# Patient Record
Sex: Female | Born: 1980 | Race: Black or African American | Hispanic: No | Smoking: Never smoker
Health system: Southern US, Community
[De-identification: ages and names within clinical notes are randomized; demographics above are authoritative.]

## PROBLEM LIST (undated history)

## (undated) DIAGNOSIS — F419 Anxiety disorder, unspecified: Secondary | ICD-10-CM

---

## 2013-09-25 ENCOUNTER — Emergency Department (HOSPITAL_COMMUNITY)

## 2013-09-25 ENCOUNTER — Emergency Department (HOSPITAL_COMMUNITY)
Admission: EM | Admit: 2013-09-25 | Discharge: 2013-09-25 | Disposition: A | Discharge status: Home / Self Care | Attending: Emergency Medicine | Admitting: Emergency Medicine

## 2013-09-25 ENCOUNTER — Encounter (HOSPITAL_COMMUNITY): Payer: Self-pay | Admitting: Emergency Medicine

## 2013-09-25 DIAGNOSIS — N949 Unspecified condition associated with female genital organs and menstrual cycle: Secondary | ICD-10-CM | POA: Insufficient documentation

## 2013-09-25 DIAGNOSIS — Z880 Allergy status to penicillin: Secondary | ICD-10-CM | POA: Insufficient documentation

## 2013-09-25 DIAGNOSIS — F411 Generalized anxiety disorder: Secondary | ICD-10-CM | POA: Insufficient documentation

## 2013-09-25 DIAGNOSIS — R079 Chest pain, unspecified: Secondary | ICD-10-CM | POA: Insufficient documentation

## 2013-09-25 DIAGNOSIS — R209 Unspecified disturbances of skin sensation: Secondary | ICD-10-CM | POA: Insufficient documentation

## 2013-09-25 HISTORY — DX: Anxiety disorder, unspecified: F41.9

## 2013-09-25 LAB — BASIC METABOLIC PANEL
BUN: 13 mg/dL (ref 6–23)
CO2: 28 mEq/L (ref 19–32)
Calcium: 8.8 mg/dL (ref 8.4–10.5)
Chloride: 101 mEq/L (ref 96–112)
Creatinine, Ser: 0.62 mg/dL (ref 0.50–1.10)
GFR calc Af Amer: >90 mL/min (ref >90)
GFR calc non Af Amer: >90 mL/min (ref >90)
Glucose, Bld: 82 mg/dL (ref 70–99)
Potassium: 4 mEq/L (ref 3.7–5.3)
Sodium: 136 mEq/L — ABNORMAL LOW (ref 137–147)

## 2013-09-25 LAB — CBC
HCT: 35.2 % — ABNORMAL LOW (ref 36.0–46.0)
Hemoglobin: 11 g/dL — ABNORMAL LOW (ref 12.0–15.0)
MCH: 21.2 pg — ABNORMAL LOW (ref 26.0–34.0)
MCHC: 31.3 g/dL (ref 30.0–36.0)
MCV: 67.7 fL — ABNORMAL LOW (ref 78.0–100.0)
Platelets: 226 10*3/uL (ref 150–400)
RBC: 5.2 MIL/uL — ABNORMAL HIGH (ref 3.87–5.11)
RDW: 15.4 % (ref 11.5–15.5)
WBC: 9.2 10*3/uL (ref 4.0–10.5)

## 2013-09-25 LAB — POCT I-STAT TROPONIN I: Troponin i, poc: 0.01 ng/mL (ref 0.00–0.08)

## 2013-09-25 MED ORDER — LORAZEPAM 0.5 MG PO TABS: 0.5000 mg | ORAL_TABLET | Freq: Three times a day (TID) | ORAL | Status: AC | PRN

## 2013-09-25 NOTE — Discharge Instructions (Signed)
Chest Pain (Nonspecific) °It is often hard to give a specific diagnosis for the cause of chest pain. There is always a chance that your pain could be related to something serious, such as a heart attack or a blood clot in the lungs. You need to follow up with your caregiver for further evaluation. °CAUSES  °· Heartburn. °· Pneumonia or bronchitis. °· Anxiety or stress. °· Inflammation around your heart (pericarditis) or lung (pleuritis or pleurisy). °· A blood clot in the lung. °· A collapsed lung (pneumothorax). It can develop suddenly on its own (spontaneous pneumothorax) or from injury (trauma) to the chest. °· Shingles infection (herpes zoster virus). °The chest wall is composed of bones, muscles, and cartilage. Any of these can be the source of the pain. °· The bones can be bruised by injury. °· The muscles or cartilage can be strained by coughing or overwork. °· The cartilage can be affected by inflammation and become sore (costochondritis). °DIAGNOSIS  °Lab tests or other studies, such as X-rays, electrocardiography, stress testing, or cardiac imaging, may be needed to find the cause of your pain.  °TREATMENT  °· Treatment depends on what may be causing your chest pain. Treatment may include: °· Acid blockers for heartburn. °· Anti-inflammatory medicine. °· Pain medicine for inflammatory conditions. °· Antibiotics if an infection is present. °· You may be advised to change lifestyle habits. This includes stopping smoking and avoiding alcohol, caffeine, and chocolate. °· You may be advised to keep your head raised (elevated) when sleeping. This reduces the chance of acid going backward from your stomach into your esophagus. °· Most of the time, nonspecific chest pain will improve within 2 to 3 days with rest and mild pain medicine. °HOME CARE INSTRUCTIONS  °· If antibiotics were prescribed, take your antibiotics as directed. Finish them even if you start to feel better. °· For the next few days, avoid physical  activities that bring on chest pain. Continue physical activities as directed. °· Do not smoke. °· Avoid drinking alcohol. °· Only take over-the-counter or prescription medicine for pain, discomfort, or fever as directed by your caregiver. °· Follow your caregiver's suggestions for further testing if your chest pain does not go away. °· Keep any follow-up appointments you made. If you do not go to an appointment, you could develop lasting (chronic) problems with pain. If there is any problem keeping an appointment, you must call to reschedule. °SEEK MEDICAL CARE IF:  °· You think you are having problems from the medicine you are taking. Read your medicine instructions carefully. °· Your chest pain does not go away, even after treatment. °· You develop a rash with blisters on your chest. °SEEK IMMEDIATE MEDICAL CARE IF:  °· You have increased chest pain or pain that spreads to your arm, neck, jaw, back, or abdomen. °· You develop shortness of breath, an increasing cough, or you are coughing up blood. °· You have severe back or abdominal pain, feel nauseous, or vomit. °· You develop severe weakness, fainting, or chills. °· You have a fever. °THIS IS AN EMERGENCY. Do not wait to see if the pain will go away. Get medical help at once. Call your local emergency services (911 in [place] Do not drive yourself to the hospital. °MAKE SURE YOU:  °· Understand these instructions. °· Will watch your condition. °· Will get help right away if you are not doing well or get worse. °Document Released: 05/25/2005 Document Revised: 11/07/2011 Document Reviewed: 03/20/2008 °Exit[employer]® Patient Information ©2014 [employer]   [employer].  Electrocardiography Electrocardiography is an important test that records the electrical impulses of the heart. It assesses many aspects of heart health, including:  Heart function.  Heart rhythm.  Heart muscle thickness. Electrocardiography can be done as a routine part of a physical exam. It can also be  done to evaluate symptoms such as severe chest pain and heart palpitations. PROCEDURE   Electrocardiography is simple, safe, and painless. It takes only a few minutes to perform. No electricity goes through your body during the procedure.  You will be asked to remove your clothes from the waist up and lie on your back for the test.  Sticky patches (electrodes) will be placed on your chest, arms, and legs. The electrodes will be attached by wires to the electrocardiography machine.  You will be asked to relax and lie still for a few seconds while the electrocardiography machine records the electrical activity of your heart. AFTER THE PROCEDURE  If electrocardiography is part of a routine physical exam, you may return to normal activities as told by your caregiver.  Your caregiver or a heart doctor (cardiologist) will interpret the recording.  The test result may not be available during your visit. If your test result is not back during the visit, make an appointment with your caregiver to find out the result. It is important for you to follow up on your test result. Document Released: 08/12/2000 Document Revised: 02/14/2012 Document Reviewed: 12/26/2011 [facility] Patient Information 2014 [place], [place].

## 2013-09-25 NOTE — ED Provider Notes (Signed)
Medical screening examination/treatment/procedure(s) were performed by non-physician practitioner and as supervising physician I was immediately available for consultation/collaboration.  EKG Interpretation    Date/Time:  Wednesday September 25 2013 16:22:35 EST Ventricular Rate:  67 PR Interval:  166 QRS Duration: 73 QT Interval:  391 QTC Calculation: 413 R Axis:   61 Text Interpretation:  Sinus rhythm Baseline wander in lead(s) V3 V4 V5 V6 Confirmed by Romeo AppleHARRISON  MD, Kinisha Soper ([id number]) on 09/25/2013 11:48:51 PM              Randa SpikeForrest Mort SawyersS Charlei Ramsaran, MD 09/25/13 2349

## 2013-09-25 NOTE — ED Provider Notes (Signed)
CSN: [id number]     Arrival date & time 09/25/13  1608 History   First MD Initiated Contact with Patient 09/25/13 1609     Chief Complaint  Patient presents with  . Chest Pain  . Numbness   (Consider location/radiation/quality/duration/timing/severity/associated sxs/prior Treatment) HPI  Patient presents to the ER with complaints of heart pains for the past two weeks. She says that intermittently has pains in her heart that last 15 minutes. The start after she has been talking to someone for 2-3 minutes or if she is "thinking about something". She says that she gets to where she is unable to speak, her arm and face get tingly. She admits that her and her mom are higher stress than her sister but she is resistant to the symptoms being caused by anxiety. She says if she focuses on her breathing and relaxes she is able to get the symptoms to go away. She has never become diaphoretic, nauseous, had vomiting, diarrhea, weakness, coughing, confusion or any other symptoms associated. She reports this happened back in 2009 and they went away after divorcing her husband.  Past Medical History  Diagnosis Date  . Anxiety    No past surgical history on file. No family history on file. History  Substance Use Topics  . Smoking status: Never Smoker   . Smokeless tobacco: Not on file  . Alcohol Use: Not on file   OB History   Grav Para Term Preterm Abortions TAB SAB Ect Mult Living                 Review of Systems  Constitutional: Negative for fever, chills, diaphoresis and fatigue.  Respiratory: Positive for chest tightness.   Cardiovascular: Positive for chest pain.  Endocrine: Negative for polydipsia.  Genitourinary: Positive for pelvic pain. Negative for dysuria.  Neurological: Negative for light-headedness and numbness.    Allergies  Penicillins  Home Medications   Current Outpatient Rx  Name  Route  Sig  Dispense  Refill  . LORazepam (ATIVAN) 0.5 MG tablet   Oral   Take 1  tablet (0.5 mg total) by mouth 3 (three) times daily as needed (for chest tightness).   15 tablet   0    BP 110/71  Pulse 75  Temp(Src) 98.4 F (36.9 C) (Oral)  Resp 23  SpO2 100%  LMP 09/18/2013 Physical Exam  Nursing note and vitals reviewed. Constitutional: She appears well-developed and well-nourished. No distress.  HENT:  Head: Normocephalic and atraumatic.  Eyes: Pupils are equal, round, and reactive to light.  Neck: Normal range of motion. Neck supple.  Cardiovascular: Normal rate and regular rhythm.   Pulmonary/Chest: Effort normal and breath sounds normal. No accessory muscle usage. No respiratory distress. Chest wall is not dull to percussion. She exhibits no mass, no tenderness, no bony tenderness, no edema, no deformity and no retraction.  Abdominal: Soft.  Neurological: She is alert.  Skin: Skin is warm and dry.    ED Course  Procedures (including critical care time) Labs Review Labs Reviewed  CBC - Abnormal; Notable for the following:    RBC 5.20 (*)    Hemoglobin 11.0 (*)    HCT 35.2 (*)    MCV 67.7 (*)    MCH 21.2 (*)    All other components within normal limits  BASIC METABOLIC PANEL - Abnormal; Notable for the following:    Sodium 136 (*)    All other components within normal limits  POCT I-STAT TROPONIN I   Imaging  Review Dg Chest 2 View  09/25/2013   CLINICAL DATA:  Upper left chest and upper extremity discomfort.  EXAM: CHEST  2 VIEW  COMPARISON:  None.  FINDINGS: The lungs are mildly hypoinflated. The interstitial markings are prominent. The cardiac silhouette and mediastinal structures are within the limits of normal. The pulmonary vascularity is not engorged. There is no pleural effusion or pneumothorax. The observed portions of the bony thorax appear normal.  IMPRESSION: There prominence of the interstitial markings likely secondary to hypo inflation. There is no alveolar pneumonia nor evidence of CHF. One cannot exclude acute bronchitis in the  appropriate clinical setting.   Electronically Signed   By: David  Swazil[place]   On: 09/25/2013 18:35    EKG Interpretation   None       MDM   1. Chest pain    Patients pains and symptoms seem to be consistent with having panic attacks but she is resistant to this being the cause of her pains. She had pain this morning and has no other episodes. She is PERC negative and the intermittent nature of her pains is not consistent with PE, pericarditis, cardiovascular disease. She may be having intermittent arrhythmias. Will refer to cardiology to have patient wear halter monitor to see what her heart is doing when she is symptomatic.  33 y.o.Naoma Emanuele's evaluation in the Emergency Department is complete. It has been determined that no acute conditions requiring further emergency intervention are present at this time. The patient/guardian have been advised of the diagnosis and plan. We have discussed signs and symptoms that warrant return to the ED, such as changes or worsening in symptoms.  Vital signs are stable at discharge. Filed Vitals:   09/25/13 1612  BP: 110/71  Pulse: 75  Temp: 98.4 F (36.9 C)  Resp: 23    Patient/guardian has voiced understanding and agreed to follow-up with the PCP or specialist.     Dorthula Matasiffany G Pamula Luther, PA-C 09/25/13 2137

## 2013-09-25 NOTE — ED Notes (Signed)
Pt c/o "tightness around heart" for 2 weeks. Pt states the tightness is associated with numbness in her L arm. Pt states now over the past 2 days she gets dizzy when she feels "pinching" around her heart. Pt denies nausea. Pt states she has had shortness of breath for 2 days with these symptoms also. Pt denies any chest pain at present. Pt alert, no acute distress. Skin warm and dry.

## 2015-04-28 IMAGING — CR DG CHEST 2V
2 series · 2 of 2 positions shown · non-contrast
Comparison: None.

CLINICAL DATA: Upper left chest and upper extremity discomfort.

EXAM:
CHEST  2 VIEW

[w chest pa]
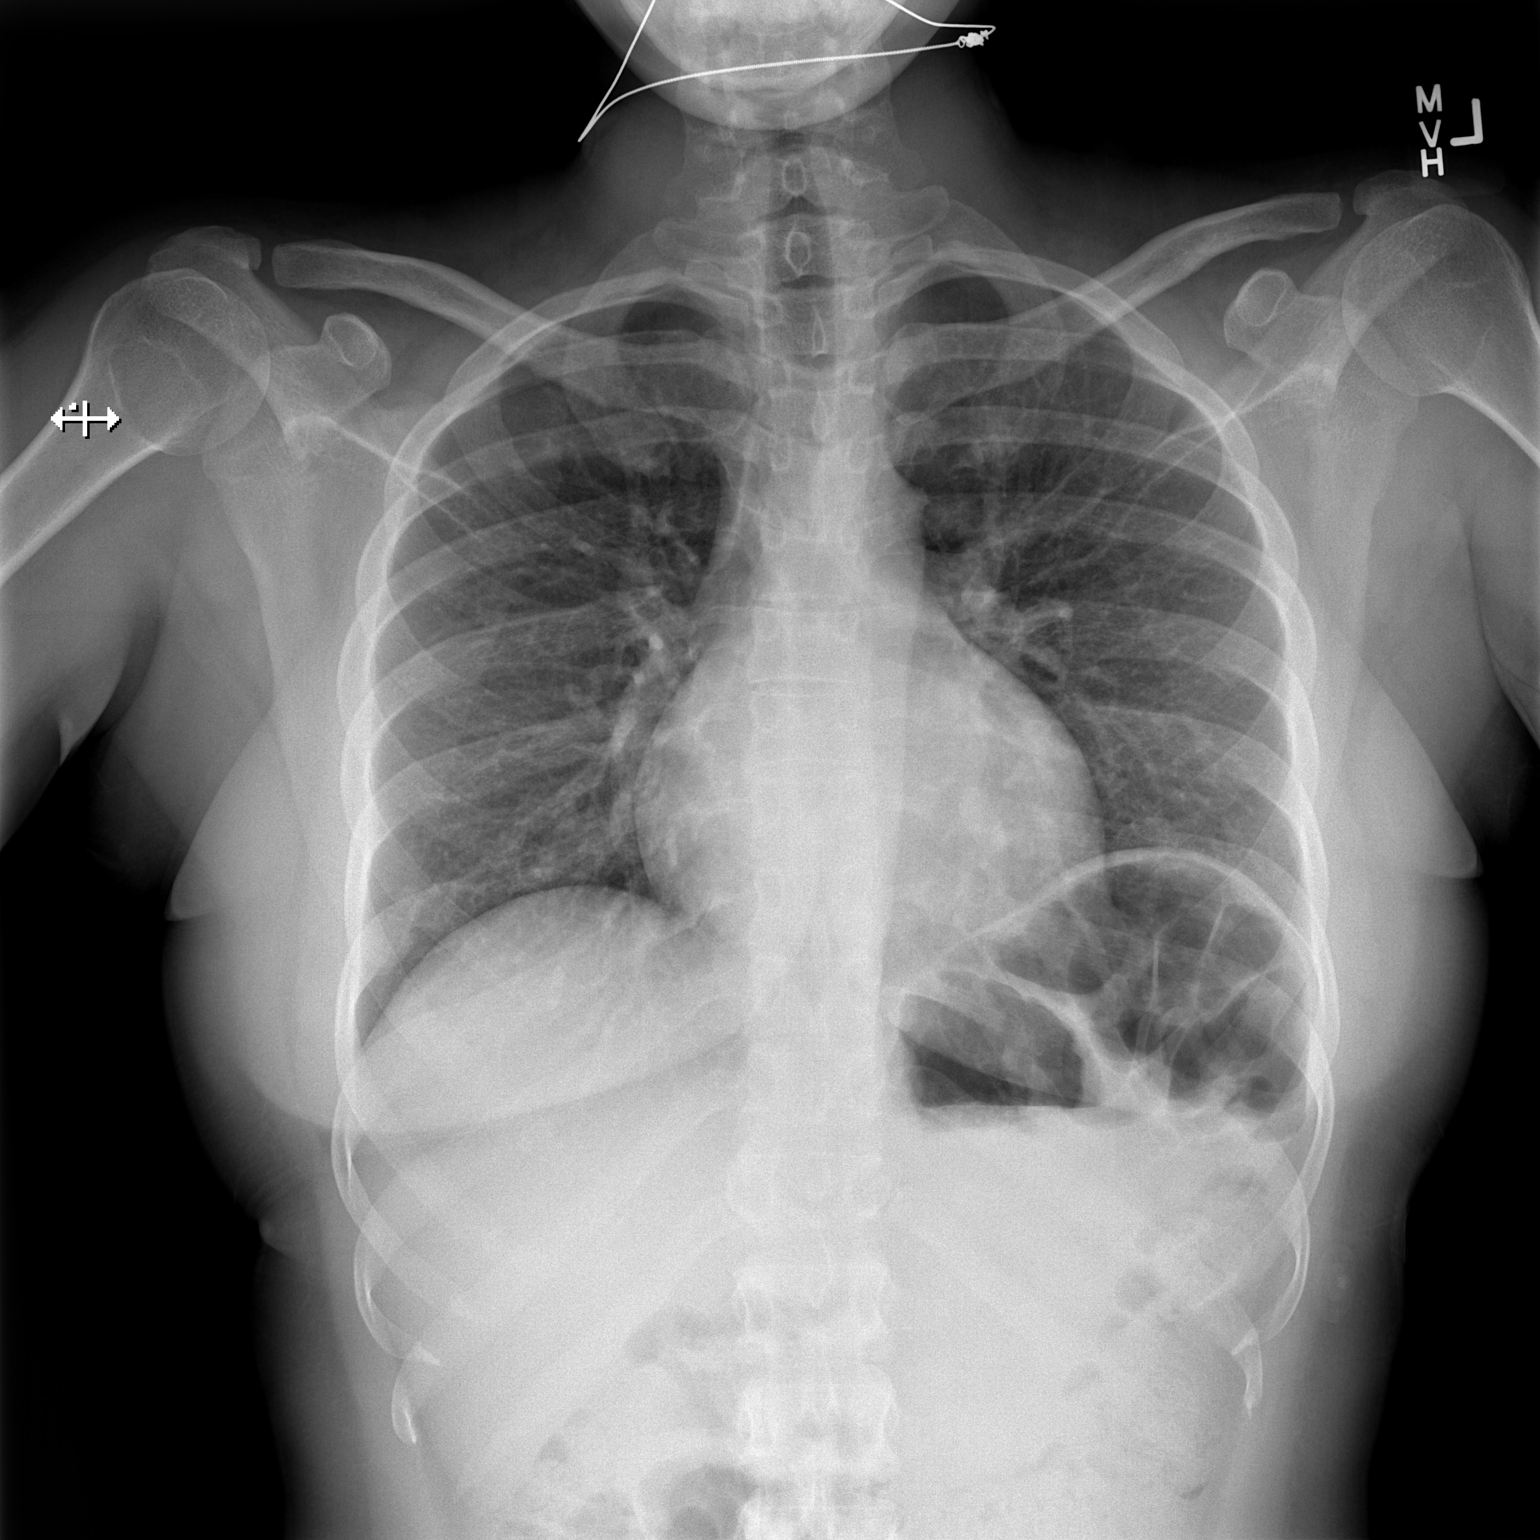

[w chest lat]
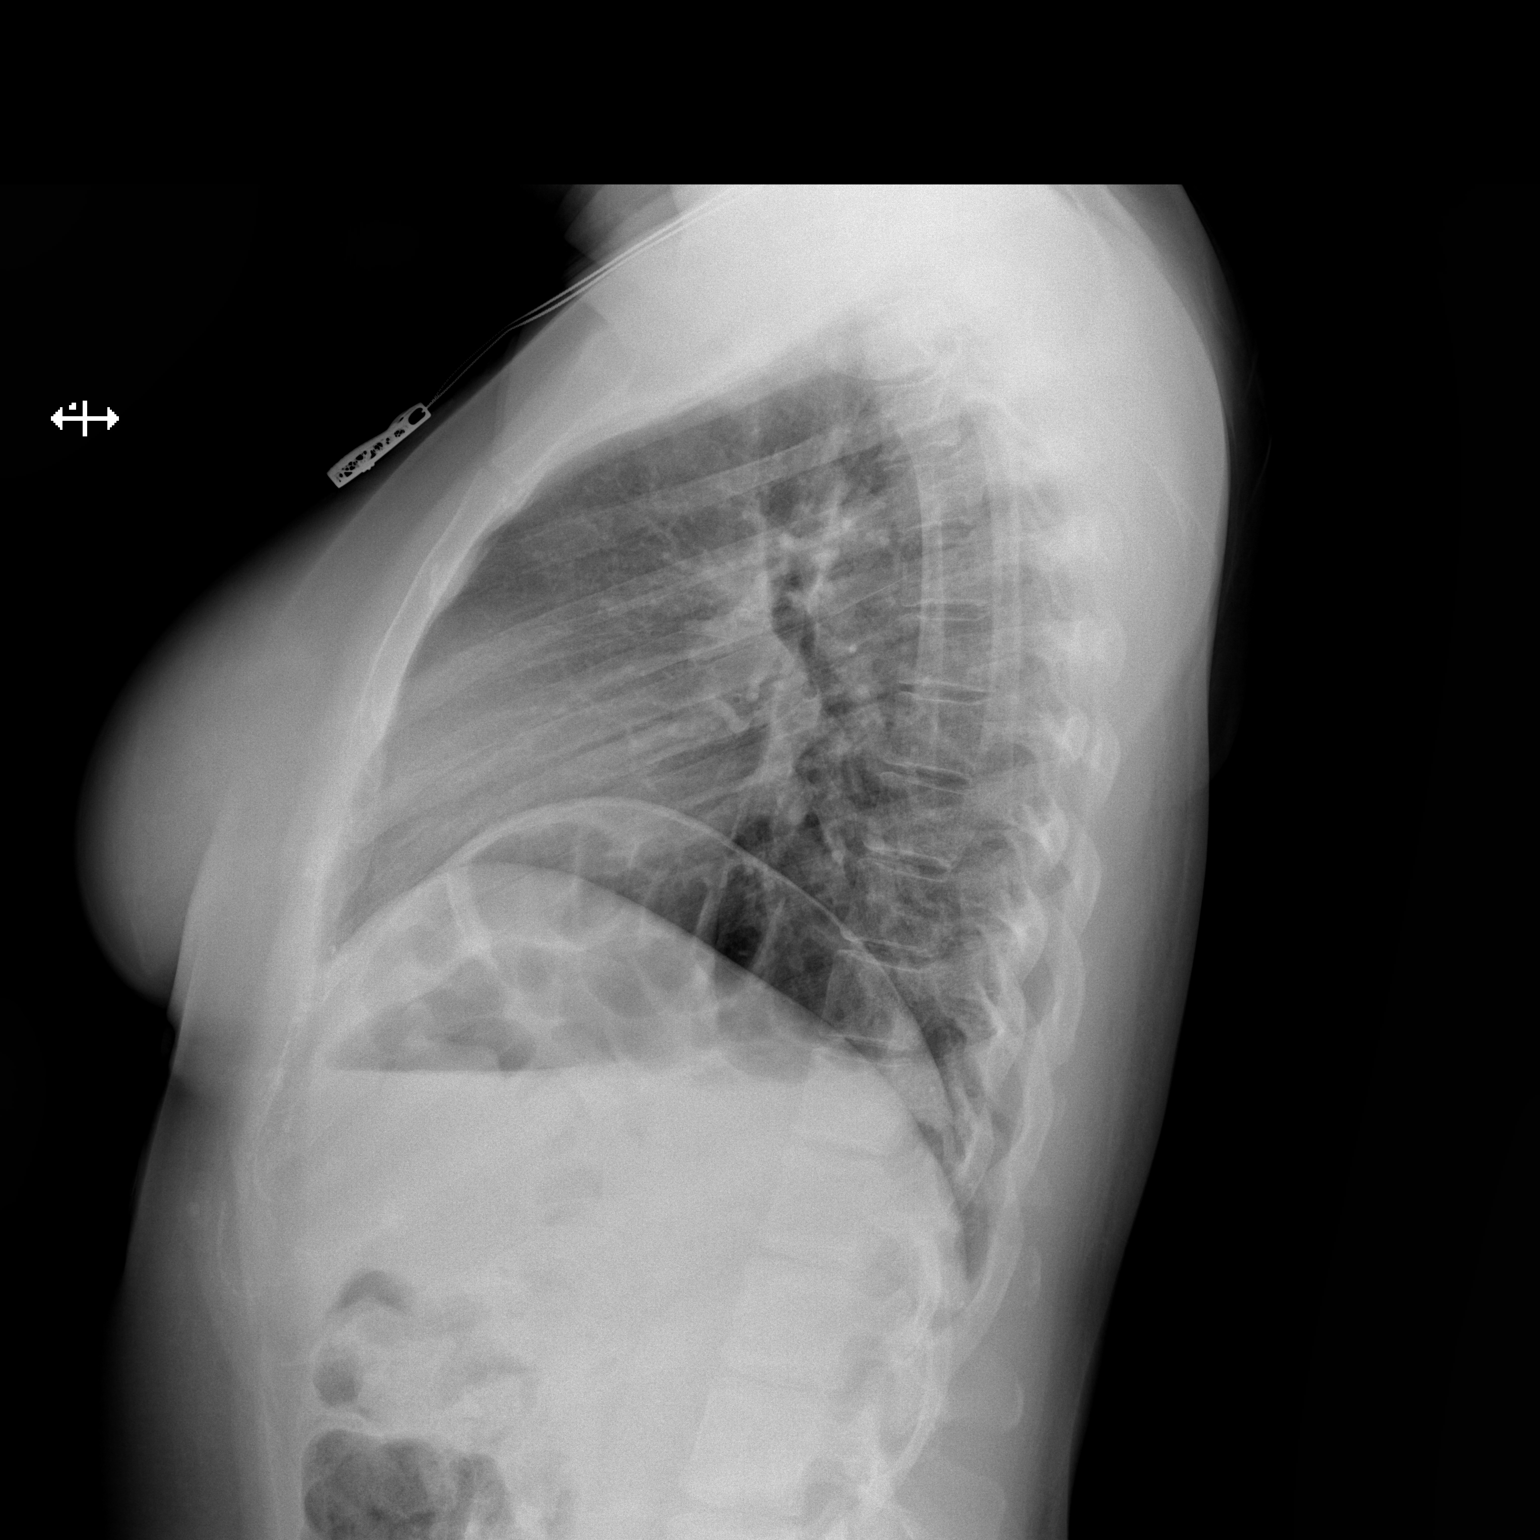

[2 of 2 positions shown; findings below may reference images not displayed]

FINDINGS: The lungs are mildly hypoinflated. The interstitial markings are
prominent. The cardiac silhouette and mediastinal structures are
within the limits of normal. The pulmonary vascularity is not
engorged. There is no pleural effusion or pneumothorax. The observed
portions of the bony thorax appear normal.
IMPRESSION: There prominence of the interstitial markings likely secondary to
hypo inflation. There is no alveolar pneumonia nor evidence of CHF.
One cannot exclude acute bronchitis in the appropriate clinical
setting.
